# Patient Record
Sex: Female | Born: 2000 | Race: Black or African American | Hispanic: No | Marital: Single | Smoking: Never smoker
Health system: Southern US, Community
[De-identification: ages and names within clinical notes are randomized; demographics above are authoritative.]

## PROBLEM LIST (undated history)

## (undated) DIAGNOSIS — M1990 Unspecified osteoarthritis, unspecified site: Secondary | ICD-10-CM

---
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
## 2019-04-11 ENCOUNTER — Ambulatory Visit: Payer: BC Managed Care – PPO | Attending: Internal Medicine

## 2019-04-11 DIAGNOSIS — Z20822 Contact with and (suspected) exposure to covid-19: Secondary | ICD-10-CM

## 2019-04-12 LAB — NOVEL CORONAVIRUS, NAA: SARS-CoV-2, NAA: NOT DETECTED

## 2019-04-28 ENCOUNTER — Ambulatory Visit: Payer: BC Managed Care – PPO | Attending: Internal Medicine

## 2019-04-28 DIAGNOSIS — Z20822 Contact with and (suspected) exposure to covid-19: Secondary | ICD-10-CM

## 2019-04-30 LAB — NOVEL CORONAVIRUS, NAA: SARS-CoV-2, NAA: NOT DETECTED

## 2020-04-18 ENCOUNTER — Ambulatory Visit: Payer: BC Managed Care – PPO | Attending: Family

## 2020-04-18 DIAGNOSIS — Z23 Encounter for immunization: Secondary | ICD-10-CM

## 2020-08-14 NOTE — Progress Notes (Signed)
   Covid-19 Vaccination Clinic  Name:  Lindsey Arnold    MRN: [id number] DOB: December 15, 2000  08/14/2020  [name] was observed post Covid-19 immunization for 15 minutes without incident. She was provided with Vaccine Information Sheet and instruction to access the V-Safe system.   [name] was instructed to call 911 with any severe reactions post vaccine: Marland Kitchen Difficulty breathing  . Swelling of face and throat  . A fast heartbeat  . A bad rash all over body  . Dizziness and weakness   Immunizations Administered    Name Date Dose VIS Date Route   Moderna Covid-19 Booster Vaccine 04/18/2020 10:45 AM 0.25 mL 01/31/2020 Intramuscular   Manufacturer: Moderna   Lot: [id number]   NDC: 73532-992-42
